# Patient Record
Sex: Male | Born: 1990 | Hispanic: Yes | Marital: Single
Health system: Southern US, Community
[De-identification: ages and names within clinical notes are randomized; demographics above are authoritative.]

---

## 2019-02-22 ENCOUNTER — Other Ambulatory Visit: Payer: Self-pay

## 2019-02-22 ENCOUNTER — Emergency Department (HOSPITAL_COMMUNITY)
Admission: EM | Admit: 2019-02-22 | Discharge: 2019-02-22 | Disposition: A | Discharge status: Home / Self Care | Payer: Self-pay | Attending: Emergency Medicine | Admitting: Emergency Medicine

## 2019-02-22 ENCOUNTER — Emergency Department (HOSPITAL_COMMUNITY): Payer: Self-pay

## 2019-02-22 ENCOUNTER — Encounter (HOSPITAL_COMMUNITY): Payer: Self-pay | Admitting: Emergency Medicine

## 2019-02-22 DIAGNOSIS — Y929 Unspecified place or not applicable: Secondary | ICD-10-CM | POA: Insufficient documentation

## 2019-02-22 DIAGNOSIS — Y9389 Activity, other specified: Secondary | ICD-10-CM | POA: Insufficient documentation

## 2019-02-22 DIAGNOSIS — Y999 Unspecified external cause status: Secondary | ICD-10-CM | POA: Insufficient documentation

## 2019-02-22 DIAGNOSIS — S61511A Laceration without foreign body of right wrist, initial encounter: Secondary | ICD-10-CM | POA: Insufficient documentation

## 2019-02-22 DIAGNOSIS — S51811A Laceration without foreign body of right forearm, initial encounter: Secondary | ICD-10-CM

## 2019-02-22 MED ORDER — BACITRACIN ZINC 500 UNIT/GM EX OINT: TOPICAL_OINTMENT | Freq: Two times a day (BID) | CUTANEOUS | Status: DC

## 2019-02-22 MED ORDER — LIDOCAINE-EPINEPHRINE (PF) 2 %-1:200000 IJ SOLN
10.0000 mL | Freq: Once | INTRAMUSCULAR | Status: AC
  Administered 2019-02-22: 10 mL
  Filled 2019-02-22: qty 20

## 2019-02-22 NOTE — ED Notes (Signed)
Wound cleaned, bacitracin applied, nonadherent pad, and wrapped in kerlix.
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
## 2019-02-22 NOTE — ED Notes (Signed)
Patient transported to X-ray 

## 2019-02-22 NOTE — ED Notes (Signed)
Patient verbalizes understanding of discharge instructions. Opportunity for questioning and answers were provided. Armband removed by staff, pt discharged from ED.  

## 2019-02-22 NOTE — Discharge Instructions (Signed)
Clean wound twice daily with water and soap.  Do not apply hydrogen peroxide.  Apply a small amount of triple antibiotic ointment or bacitracin and a clean dry dressing.  Return to the emergency room in 10 to 14 days for suture removal or sooner for any concerns, redness, swelling, discharge.

## 2020-07-18 IMAGING — CR RIGHT FOREARM - 2 VIEW
2 series · 2 of 2 positions shown · non-contrast
Comparison: None.

CLINICAL DATA: Lacerations.  Fall.

EXAM:
RIGHT FOREARM - 2 VIEW

[forearm ap]
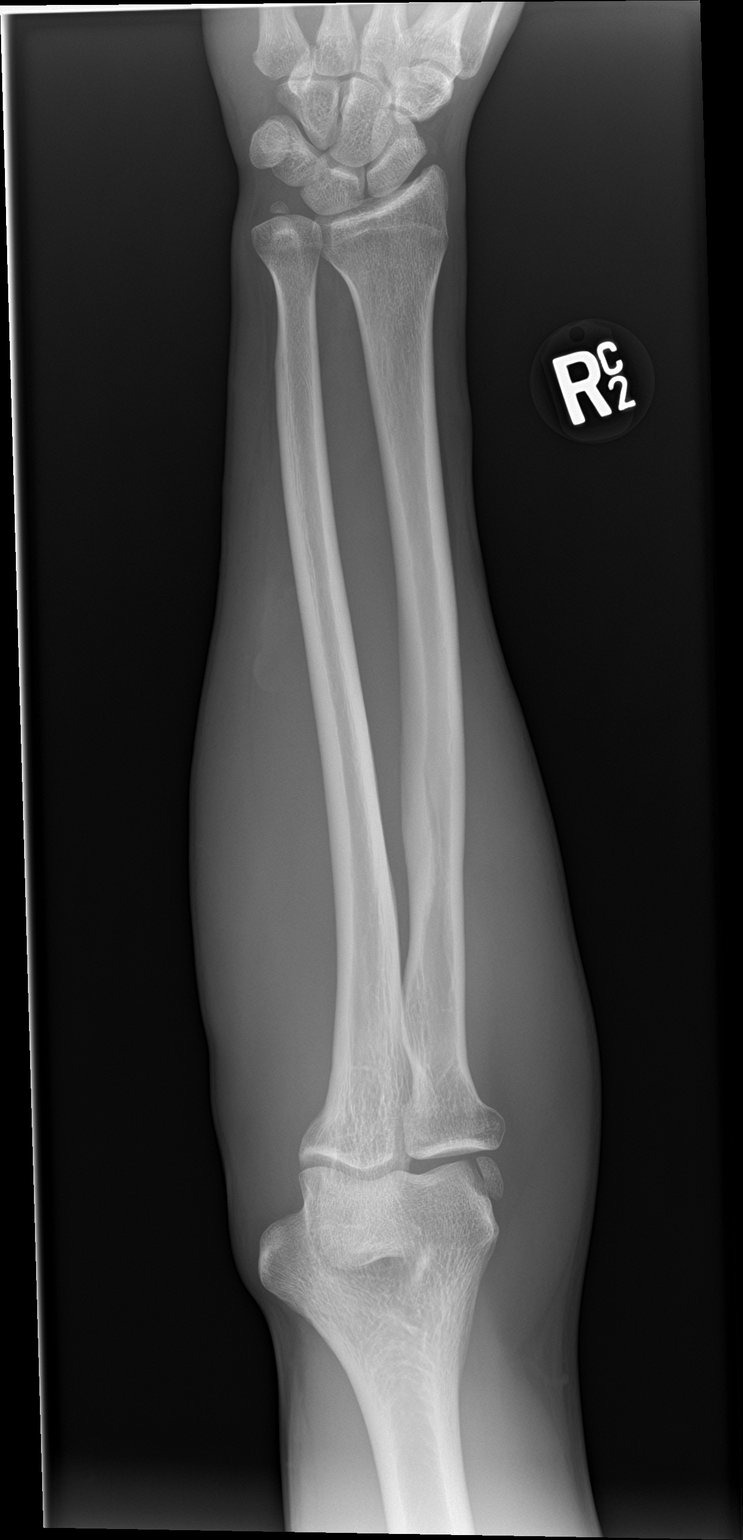

[forearm lat]
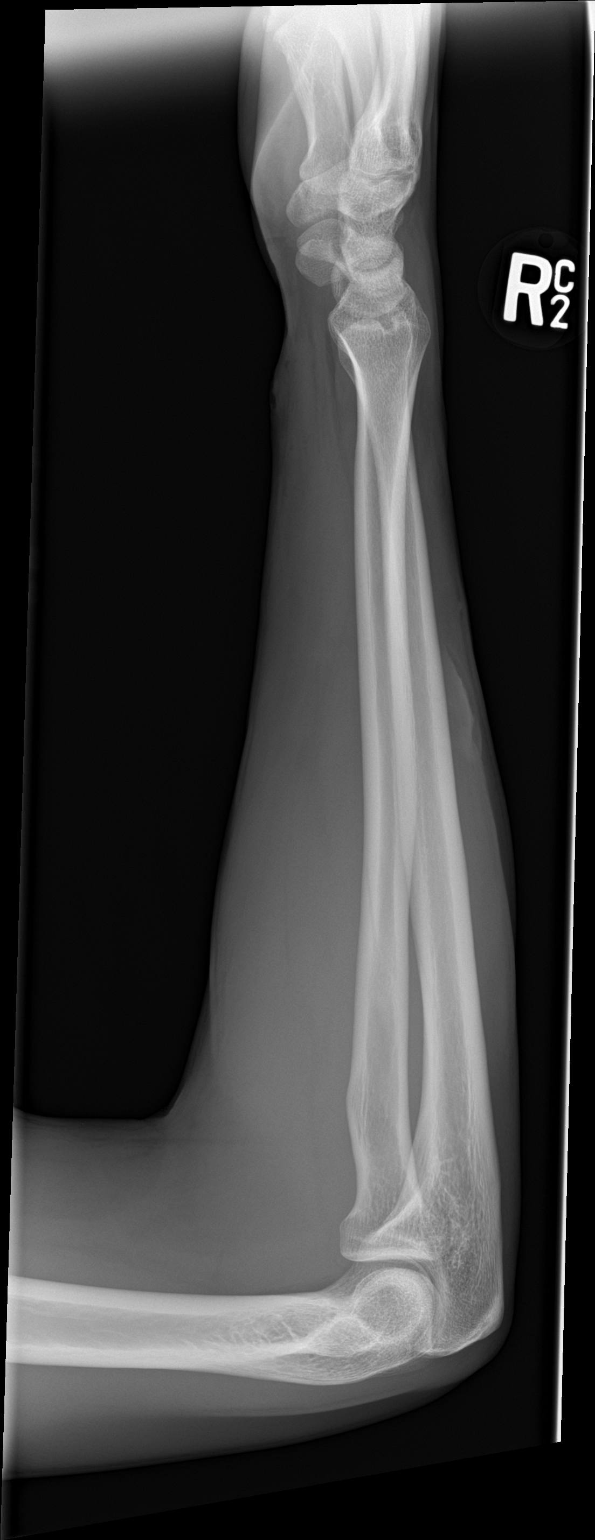

[2 of 2 positions shown; findings below may reference images not displayed]

FINDINGS: The laceration is seen along forearm. No foreign body identified. No
fractures are noted.
IMPRESSION: No foreign bodies or fractures.
